# Patient Record
Sex: Male | Born: 1965 | Race: White | Hispanic: No | Marital: Single
Health system: Southern US, Community
[De-identification: ages and names within clinical notes are randomized; demographics above are authoritative.]

## PROBLEM LIST (undated history)

## (undated) DIAGNOSIS — I10 Essential (primary) hypertension: Secondary | ICD-10-CM

---

## 2018-09-28 ENCOUNTER — Encounter (HOSPITAL_COMMUNITY): Payer: Self-pay

## 2018-09-28 ENCOUNTER — Emergency Department (HOSPITAL_COMMUNITY)
Admission: EM | Admit: 2018-09-28 | Discharge: 2018-09-28 | Disposition: A | Discharge status: Home / Self Care | Payer: BLUE CROSS/BLUE SHIELD | Attending: Emergency Medicine | Admitting: Emergency Medicine

## 2018-09-28 ENCOUNTER — Emergency Department (HOSPITAL_COMMUNITY): Payer: BLUE CROSS/BLUE SHIELD

## 2018-09-28 DIAGNOSIS — M542 Cervicalgia: Secondary | ICD-10-CM | POA: Diagnosis not present

## 2018-09-28 HISTORY — DX: Essential (primary) hypertension: I10

## 2018-09-28 MED ORDER — METHOCARBAMOL 500 MG PO TABS: 500.0000 mg | ORAL_TABLET | Freq: Two times a day (BID) | ORAL | 0 refills | Status: AC

## 2018-09-28 MED ORDER — DIAZEPAM 2 MG PO TABS
2.0000 mg | ORAL_TABLET | Freq: Once | ORAL | Status: AC
  Administered 2018-09-28: 2 mg via ORAL
  Filled 2018-09-28: qty 1
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
## 2018-09-28 NOTE — Discharge Instructions (Addendum)
I have prescribed muscle relaxers for your pain, please do not drink or drive while taking this medications as they can make you drowsy.  Please follow-up with PCP in 1 week for reevaluation of your symptoms.  You experience any bowel or bladder incontinence, fever, worsening in your symptoms please return to the ED. ° °

## 2018-09-28 NOTE — ED Notes (Signed)
Patient verbalizes understanding of discharge instructions. Opportunity for questioning and answers were provided. Armband removed by staff, pt discharged from ED ambulatory.   
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
